# Patient Record
Sex: Female | Born: 1965 | Race: White | Hispanic: No | Marital: Married | Smoking: Never smoker
Health system: Southern US, Community
[De-identification: ages and names within clinical notes are randomized; demographics above are authoritative.]

## PROBLEM LIST (undated history)

## (undated) HISTORY — PX: OVARIAN CYST REMOVAL: SHX89

---

## 1997-12-21 ENCOUNTER — Ambulatory Visit (HOSPITAL_COMMUNITY): Admission: RE | Admit: 1997-12-21 | Discharge: 1997-12-21 | Payer: Self-pay | Admitting: Obstetrics and Gynecology

## 1998-03-10 ENCOUNTER — Inpatient Hospital Stay (HOSPITAL_COMMUNITY): Admission: AD | Admit: 1998-03-10 | Discharge: 1998-03-12 | Payer: Self-pay | Admitting: Obstetrics and Gynecology

## 1998-05-05 ENCOUNTER — Encounter (HOSPITAL_COMMUNITY): Admission: RE | Admit: 1998-05-05 | Discharge: 1998-08-03 | Payer: Self-pay | Admitting: Obstetrics and Gynecology

## 1998-08-08 ENCOUNTER — Encounter: Admission: RE | Admit: 1998-08-08 | Discharge: 1998-11-06 | Payer: Self-pay | Admitting: Obstetrics and Gynecology

## 1999-03-16 ENCOUNTER — Ambulatory Visit (HOSPITAL_BASED_OUTPATIENT_CLINIC_OR_DEPARTMENT_OTHER): Admission: RE | Admit: 1999-03-16 | Discharge: 1999-03-16 | Payer: Self-pay | Admitting: Surgery

## 2004-06-26 ENCOUNTER — Encounter: Admission: RE | Admit: 2004-06-26 | Discharge: 2004-06-26 | Payer: Self-pay | Admitting: Obstetrics and Gynecology

## 2006-11-12 ENCOUNTER — Encounter: Admission: RE | Admit: 2006-11-12 | Discharge: 2006-11-12 | Payer: Self-pay | Admitting: Obstetrics and Gynecology

## 2007-02-28 ENCOUNTER — Inpatient Hospital Stay (HOSPITAL_COMMUNITY): Admission: AD | Admit: 2007-02-28 | Discharge: 2007-02-28 | Payer: Self-pay | Admitting: Obstetrics and Gynecology

## 2007-05-08 ENCOUNTER — Inpatient Hospital Stay (HOSPITAL_COMMUNITY): Admission: AD | Admit: 2007-05-08 | Discharge: 2007-05-08 | Payer: Self-pay | Admitting: Obstetrics and Gynecology

## 2007-05-25 ENCOUNTER — Inpatient Hospital Stay (HOSPITAL_COMMUNITY): Admission: AD | Admit: 2007-05-25 | Discharge: 2007-05-25 | Payer: Self-pay | Admitting: Obstetrics and Gynecology

## 2007-05-28 ENCOUNTER — Inpatient Hospital Stay (HOSPITAL_COMMUNITY): Admission: AD | Admit: 2007-05-28 | Discharge: 2007-05-28 | Payer: Self-pay | Admitting: Obstetrics and Gynecology

## 2007-06-16 ENCOUNTER — Inpatient Hospital Stay (HOSPITAL_COMMUNITY): Admission: AD | Admit: 2007-06-16 | Discharge: 2007-06-20 | Payer: Self-pay | Admitting: Obstetrics and Gynecology

## 2007-06-17 ENCOUNTER — Encounter: Payer: Self-pay | Admitting: Obstetrics and Gynecology

## 2007-06-17 ENCOUNTER — Encounter (INDEPENDENT_AMBULATORY_CARE_PROVIDER_SITE_OTHER): Payer: Self-pay | Admitting: Obstetrics and Gynecology

## 2007-06-22 ENCOUNTER — Encounter: Admission: RE | Admit: 2007-06-22 | Discharge: 2007-07-22 | Payer: Self-pay | Admitting: Obstetrics and Gynecology

## 2007-07-02 ENCOUNTER — Ambulatory Visit: Admission: RE | Admit: 2007-07-02 | Discharge: 2007-07-02 | Payer: Self-pay | Admitting: Obstetrics and Gynecology

## 2007-07-13 ENCOUNTER — Ambulatory Visit: Admission: RE | Admit: 2007-07-13 | Discharge: 2007-07-13 | Payer: Self-pay | Admitting: Obstetrics and Gynecology

## 2007-07-23 ENCOUNTER — Encounter: Admission: RE | Admit: 2007-07-23 | Discharge: 2007-08-21 | Payer: Self-pay | Admitting: Obstetrics and Gynecology

## 2007-08-22 ENCOUNTER — Encounter: Admission: RE | Admit: 2007-08-22 | Discharge: 2007-09-14 | Payer: Self-pay | Admitting: Obstetrics and Gynecology

## 2008-03-21 ENCOUNTER — Encounter: Admission: RE | Admit: 2008-03-21 | Discharge: 2008-03-21 | Payer: Self-pay | Admitting: Obstetrics and Gynecology

## 2009-03-22 ENCOUNTER — Encounter: Admission: RE | Admit: 2009-03-22 | Discharge: 2009-03-22 | Payer: Self-pay | Admitting: Obstetrics and Gynecology

## 2010-02-28 ENCOUNTER — Other Ambulatory Visit: Payer: Self-pay | Admitting: Obstetrics and Gynecology

## 2010-02-28 DIAGNOSIS — Z1239 Encounter for other screening for malignant neoplasm of breast: Secondary | ICD-10-CM

## 2010-03-23 ENCOUNTER — Ambulatory Visit
Admission: RE | Admit: 2010-03-23 | Discharge: 2010-03-23 | Disposition: A | Discharge status: Home / Self Care | Payer: BC Managed Care – PPO | Source: Ambulatory Visit | Attending: Obstetrics and Gynecology | Admitting: Obstetrics and Gynecology

## 2010-03-23 DIAGNOSIS — Z1239 Encounter for other screening for malignant neoplasm of breast: Secondary | ICD-10-CM

## 2010-06-12 NOTE — Op Note (Signed)
Nicole [name], [name]           ACCOUNT NO.:  [id number]   MEDICAL RECORD NO.:  [id number]          PATIENT TYPE:  OUT   LOCATION:  MFM                           [facility]ILITY:  [facility]   PHYSICIAN:  Malachi Pro. Ambrose Mantle, M.D. DATE OF BIRTH:  [birth year]   DATE OF PROCEDURE:  06/17/2007  DATE OF DISCHARGE:                               OPERATIVE REPORT   PREOPERATIVE DIAGNOSES:  1. Intrauterine pregnancy at 34 weeks and 6 days.  2. Intrauterine growth restriction.  3. Oligohydramnios.  4. Abnormal Dopplers.  5. Intolerance of labor with late decelerations on Pitocin induction.   POSTOPERATIVE DIAGNOSES:  1. Intrauterine pregnancy at 34 weeks and 6 days.  2. Intrauterine growth restriction.  3. Oligohydramnios.  4. Abnormal Dopplers.  5. Intolerance of labor with late decelerations on Pitocin induction.  6. Transverse lie.   OPERATION:  1. Vertical C-section.  2. Bilateral tubal ligation.   OPERATOR:  Malachi Pro. Ambrose Mantle, MD   ASSISTANT:  Nicole Niece, MD   ANESTHESIA:  Spinal anesthesia.   PROCEDURE IN DETAIL:  The patient was brought to the operating room and  given a spinal anesthetic by Dr. Mal Amabile.  She was placed in left  lateral tilt position.  The abdomen was prepped with Betadine solution  and draped as a sterile field after the urethra was prepped and a Foley  was inserted to straight drain.  After the abdomen was draped,  anesthesia was confirmed by pinching the lower abdomen.  The patient had  no sensation.  The patient had a previous low-transverse incision for a  dermoid cyst.  This was utilized, even though it was quite low.  I went  through the skin and subcutaneous tissue and fascia.  Fascia was  separated from the rectus muscles superiorly and inferiorly.  Rectus  muscle was split in the midline.  Peritoneum was opened vertically.  I  inspected the lower uterine segment and the lower uterine segment was  not developed at all.  The lower uterine segment  was estimated to be no  more than 2-1/2 inches wide.  I palpated for presentation.  The vertex  was in the right side of the uterus.  There was a shoulder over the  cervix and the breech was high on the maternal left side.  This was  truly a transverse lie.  So with a poorly developed lower uterine  segment, I made the incision in the midline of the uterus and then  extended it superiorly and inferiorly with the bandage scissors.  I  converted the baby to a breech presentation by reaching up in the left  upper quadrant of the uterus and pulling the breech down to the  incisional opening and then delivering the baby easily as a breech  presentation.  The membranes were ruptured prior to delivery.  The  fluid, I think was clear but the baby passed meconium as it was  delivered.  The cord was clamped.  A segment of cord was preserved in  case a pH was necessary.  The infant was given to Dr. Radene Ou who was in  attendance.  The baby was female infant, I think 4 pounds 2 ounces and  Apgars of 8 at 1 and 9 at 5 minutes.  The placenta was delivered.  In  order to get to the baby, I had to go through the placenta.  I  delivered the entire placenta, palpated the inside of the uterus, and  made sure it was free of any products of conception, and then I closed  the uterine incision with a running lock suture of 0 Vicryl on the first  layer and then a second layer with interrupted figure-of-eight sutures  of 0 Vicryl and a 3-0 Vicryl was needed for complete hemostasis at one  point on the upper part of the incision.  At this point, hemostasis was  adequate.  Both tubes and ovaries appeared normal.  There were few filmy  adhesions around the right ovary but basically the dermoid cystectomy  had left little evidence of its being performed.  The midportion of each  tube was removed by creating a window in the mesosalpinx bilaterally  tying 2 ties of 0 plain catgut proximally and distally on the tube and   then cutting the intervening segment.  The left tubal segment was much  shorter than the right, but the tube segment was definitely present.  Liberal irrigation confirmed hemostasis.  The gutters were blotted free  of blood.  The abdominal wall was then closed in layers using  interrupted sutures of 0 Vicryl to include the rectus muscle and the  peritoneum, 2 running sutures of 0 Vicryl on the fascia, running 3-0  Vicryl on the subcu tissue, and staples on the skin.  The patient seemed  to tolerate the procedure well.  Blood loss was about 1000 mL.  Sponge  and needle counts were correct and she was returned to recovery in  satisfactory condition.      Malachi Pro. Ambrose Mantle, M.D.  Electronically Signed     TFH/MEDQ  D:  06/17/2007  T:  06/18/2007  Job:  [id number]   cc:   Ashley Royalty, MD

## 2010-06-12 NOTE — H&P (Signed)
NAMEZOLA, RUNION           ACCOUNT NO.:  [id number]   MEDICAL RECORD NO.:  [id number]          PATIENT TYPE:  OBV   LOCATION:  9173                          [facility]ACILITY:  [facility]   PHYSICIAN:  Malachi Pro. Ambrose Mantle, M.D. DATE OF BIRTH:  1965-11-13   DATE OF ADMISSION:  06/16/2007  DATE OF DISCHARGE:                              HISTORY & PHYSICAL   HISTORY OF PRESENT ILLNESS:  This is a 45 year old white married female  para 2-0-1-2, gravida 4, EDC July 23, 2007, admitted because of non-  reassuring fetal heart rate pattern, blood group and type A negative  with a negative antibody, nonreactive serology, rubella immune,  hepatitis B surface antigen negative, HIV negative, GC and chlamydia  negative.  One hour Glucola 100, group B strep has not been done.  This  patient began her prenatal course with a vaginal ultrasound on December 18, 2006, showed a crown-rump length of 2.34 cm, 9 weeks zero days with  an [facility] of July 23, 2007.  She did have a first trimester screen with  Down syndrome risk of 1-98 and AFP was normal.  The patient did not want  amniocentesis.  She had a relatively benign prenatal course until  March 27, 2007 when at 31 weeks and 4 days, the fundal height was 25  cm.  Nonstress tests were begun.  Ultrasound done on March 27, 2007  showed an estimated fetal weight in the 30th percentile and amniotic  fluid volume was in the 20th percentile.  Nonstress tests were done  twice a week and they have been reactive.  On Jun 15, 2007, the patient  had nonstress test that was reactive, but she did have one deceleration  that lasted a little over a minute and went down to about 60 beats per  minute.  Dr. Jackelyn Knife had her come back to the office today for another  nonstress test with similar findings.  I called Dr. Katrinka Blazing from [facility].  She advised admitting the patient to the hospital  getting about physical profile and continuously monitor the patient to  see  if these are isolated episodes.   PAST MEDICAL HISTORY:  Reveals no known allergies.   OPERATIONS:  In 1972, her tongue was clipped.  In 1989, she had a  dermoid cyst from her right ovary.  In 2000, she had a cyst removed from  her right leg.   ILLNESSES:  She has had no significant adult illnesses.   SOCIAL HISTORY:  Alcohol, tobacco, and drugs none.   FAMILY HISTORY:  Maternal grandfather with high blood pressure and heart  disease and diabetes.  Brother has high blood pressure.  Paternal  grandfather showed a mild type of renal disease.  Maternal grandmother  had rheumatoid arthritis and breast cancer.   OBSTETRIC HISTORY:  In 1992, the patient had an early abortion.  In May  1998 and in February 2000, the patient delivered female and female infants  respectively 8 pounds 7 ounces and 8 pounds 9 ounces vaginally.  The  first baby had mild shoulder dystocia, second baby no complications.   PHYSICAL EXAMINATION:  VITAL SIGNS:  On admission revealed normal vital  signs.  HEART/LUNGS:  Normal.  ABDOMEN:  Soft.   She was last examined on Jun 08, 2007.  Fundal height was 30 cm.  Fetal  heart tones are reactive, but she did have one long deceleration at  least a minute and duration.   ADMITTING IMPRESSION:  Intrauterine pregnancy at 34 weeks and 5 days  with history of brief decelerations on 2 consecutive nonstress tests  performed 24 hours apart.  I have consulted with [facility].  Their advice is to admit the patient for biophysical profile and  fetal  heart rate monitoring  in the hospital.      Malachi Pro. Ambrose Mantle, M.D.  Electronically Signed     TFH/MEDQ  D:  06/16/2007  T:  06/17/2007  Job:  [id number]

## 2010-06-12 NOTE — Discharge Summary (Signed)
Nicole [name], CONVERY           ACCOUNT NO.:  [id number]   MEDICAL RECORD NO.:  [id number]          PATIENT TYPE:  INP   LOCATION:  9310                          [facility]ILITY:  [facility]   PHYSICIAN:  Zenaida Niece, M.D.DATE OF BIRTH:  08-24-1965   DATE OF ADMISSION:  06/16/2007  DATE OF DISCHARGE:  06/20/2007                               DISCHARGE SUMMARY   ADMISSION DIAGNOSES:  1. Intrauterine pregnancy at 34+ weeks.  2. Intrauterine growth restriction.  3. Oligohydramnios.   DISCHARGE DIAGNOSES:  1. Intrauterine pregnancy at 34+ weeks.  2. Intrauterine growth restriction.  3. Oligohydramnios.  4. Fetal intolerance of labor.   PROCEDURES:  1. On Jun 17, 2007, she had a vertical C-section and bilateral tubal      ligation.  2. On Jun 20, 2007, she had an epidural blood patch.   HISTORY AND PHYSICAL:  This is a 45 year old female gravida 4, para 2-0-  1-2 who is admitted after she was noted to have spontaneous decels to  60s to 12s on NST for 2 consecutive days.  Tracing was otherwise  reactive and biophysical profile 8/8, although the AFI was low normal.  Prenatal care has been otherwise uneventful except for an elevated risk  of Down syndrome of 1 in 98.   PAST OB HISTORY:  See dictated H & P.   PAST MEDICAL HISTORY:  Negative.   PAST SURGICAL HISTORY:  Tongue surgery.  She has had a right dermoid  removed and a sebaceous cyst removed from her leg.   ALLERGIES:  None known.   MEDICATIONS:  None.   PRENATAL LABS:  Blood type is A negative with negative antibody screen,  RPR nonreactive, rubella immune, hepatitis B surface antigen negative,  HIV negative, gonorrhea and chlamydia negative.  Please see dictated  history and physical for the remaining history and physical exam,  significant for gravid abdomen that was nontender.   HOSPITAL COURSE:  The patient was admitted and put on continuous  monitoring.  Overall the tracing looked reactive.  She was monitored  overnight on the Jun 16, 2007.  Early on the morning of Jun 17, 2007,  Dr. Senaida Ores was called for fetal decels.  Tracing was overall  reactive, but she overnight had had several contractions with subtle  late decels, but with good variability and had accelerations also.  She  had several spontaneous decels to the 80s to 90s with rapid recovery.  She had an ultrasound performed on the morning of Jun 17, 2007, which  revealed an amniotic fluid volume less than 30% percent, estimated fetal  weight of 10% and abnormal uterine umbilical artery Dopplers.  Dr.  Ambrose Mantle started Pitocin and the baby did not tolerate this, so on the  afternoon of Jun 17, 2007, she was taken to the operating room where Dr.  Ambrose Mantle performed a vertical C-section and tubal ligation under spinal  anesthesia.  Estimated blood loss was 1000 mL.  She delivered a viable  female with Apgars of 8 and 9 that weighed 4 pounds 2 ounces.  Postoperatively, the patient initially did well.  Preoperative  hemoglobin 11.3, postoperative 10.4.  She did complain of a headache and  was started on a caffeine drip which did eventually improve the  headache.  However, Anesthesia evaluated her on the evening of Jun 19, 2007, and did feel this was consistent with a spinal headache, but  wanted to proceed with conservative therapy.  On postoperative day #3,  Jun 20, 2007, her headache was worse, but she was otherwise doing well.  She was evaluated again by Dr. Harvest Forest and had an epidural blood patch  placed.  Her headache improved and she was felt to be stable enough for  discharge home.   DISCHARGE INSTRUCTIONS:  Regular diet, pelvic rest, no strenuous  activity.  Follow-up is in 2 weeks for an incision check.   Medications are Percocet #20 p.r.n. pain and over-the-counter ibuprofen  as needed and she is given her discharge pamphlet.      Zenaida Niece, M.D.  Electronically Signed     TDM/MEDQ  D:  07/30/2007  T:  07/30/2007   Job:  [id number]

## 2010-10-23 LAB — RH IMMUNE GLOBULIN WORKUP (NOT WOMEN'S HOSP): ABO/RH(D): A NEG

## 2010-10-23 LAB — GLUCOSE TOLERANCE, 1 HOUR: Glucose, 1 Hour GTT: 100

## 2010-10-24 LAB — RH IMMUNE GLOB WKUP(>/=20WKS)(NOT WOMEN'S HOSP)

## 2010-10-24 LAB — CBC
MCHC: 34.5
MCV: 97.3
Platelets: 165
RBC: 3.06 — ABNORMAL LOW
RBC: 3.37 — ABNORMAL LOW
WBC: 9.4

## 2010-10-24 LAB — KLEIHAUER-BETKE STAIN: # Vials RhIg: 1

## 2011-03-08 ENCOUNTER — Other Ambulatory Visit: Payer: Self-pay | Admitting: Obstetrics and Gynecology

## 2011-03-08 DIAGNOSIS — Z1231 Encounter for screening mammogram for malignant neoplasm of breast: Secondary | ICD-10-CM

## 2011-04-02 ENCOUNTER — Ambulatory Visit
Admission: RE | Admit: 2011-04-02 | Discharge: 2011-04-02 | Disposition: A | Discharge status: Home / Self Care | Payer: BC Managed Care – PPO | Source: Ambulatory Visit | Attending: Obstetrics and Gynecology | Admitting: Obstetrics and Gynecology

## 2011-04-02 DIAGNOSIS — Z1231 Encounter for screening mammogram for malignant neoplasm of breast: Secondary | ICD-10-CM

## 2012-04-28 ENCOUNTER — Other Ambulatory Visit: Payer: Self-pay

## 2012-04-28 DIAGNOSIS — Z1231 Encounter for screening mammogram for malignant neoplasm of breast: Secondary | ICD-10-CM

## 2012-06-05 ENCOUNTER — Ambulatory Visit
Admission: RE | Admit: 2012-06-05 | Discharge: 2012-06-05 | Disposition: A | Discharge status: Home / Self Care | Payer: Medicare HMO | Source: Ambulatory Visit

## 2012-06-05 DIAGNOSIS — Z1231 Encounter for screening mammogram for malignant neoplasm of breast: Secondary | ICD-10-CM

## 2013-10-25 ENCOUNTER — Other Ambulatory Visit: Payer: Self-pay

## 2013-10-25 DIAGNOSIS — Z1231 Encounter for screening mammogram for malignant neoplasm of breast: Secondary | ICD-10-CM

## 2013-11-04 ENCOUNTER — Ambulatory Visit
Admission: RE | Admit: 2013-11-04 | Discharge: 2013-11-04 | Disposition: A | Discharge status: Home / Self Care | Payer: BC Managed Care – PPO | Source: Ambulatory Visit

## 2013-11-04 DIAGNOSIS — Z1231 Encounter for screening mammogram for malignant neoplasm of breast: Secondary | ICD-10-CM

## 2014-02-08 ENCOUNTER — Other Ambulatory Visit: Payer: Self-pay | Admitting: Obstetrics and Gynecology

## 2014-02-08 DIAGNOSIS — N6011 Diffuse cystic mastopathy of right breast: Secondary | ICD-10-CM

## 2014-02-15 ENCOUNTER — Ambulatory Visit
Admission: RE | Admit: 2014-02-15 | Discharge: 2014-02-15 | Disposition: A | Discharge status: Home / Self Care | Payer: BLUE CROSS/BLUE SHIELD | Source: Ambulatory Visit | Attending: Obstetrics and Gynecology | Admitting: Obstetrics and Gynecology

## 2014-02-15 ENCOUNTER — Other Ambulatory Visit: Payer: Self-pay | Admitting: Obstetrics and Gynecology

## 2014-02-15 DIAGNOSIS — N6011 Diffuse cystic mastopathy of right breast: Secondary | ICD-10-CM

## 2015-02-17 ENCOUNTER — Other Ambulatory Visit: Payer: Self-pay

## 2015-02-17 DIAGNOSIS — Z1231 Encounter for screening mammogram for malignant neoplasm of breast: Secondary | ICD-10-CM

## 2015-03-14 ENCOUNTER — Ambulatory Visit: Payer: BLUE CROSS/BLUE SHIELD

## 2015-03-28 ENCOUNTER — Ambulatory Visit
Admission: RE | Admit: 2015-03-28 | Discharge: 2015-03-28 | Disposition: A | Discharge status: Home / Self Care | Payer: BLUE CROSS/BLUE SHIELD | Source: Ambulatory Visit

## 2015-03-28 DIAGNOSIS — Z1231 Encounter for screening mammogram for malignant neoplasm of breast: Secondary | ICD-10-CM

## 2016-03-11 ENCOUNTER — Encounter: Payer: Self-pay | Admitting: *Deleted

## 2016-03-11 ENCOUNTER — Emergency Department (INDEPENDENT_AMBULATORY_CARE_PROVIDER_SITE_OTHER)
Admission: EM | Admit: 2016-03-11 | Discharge: 2016-03-11 | Disposition: A | Discharge status: Home / Self Care | Payer: 59 | Source: Home / Self Care | Attending: Family Medicine | Admitting: Family Medicine

## 2016-03-11 DIAGNOSIS — S39012A Strain of muscle, fascia and tendon of lower back, initial encounter: Secondary | ICD-10-CM

## 2016-03-11 MED ORDER — PREDNISONE 20 MG PO TABS: ORAL_TABLET | ORAL | 0 refills | Status: AC

## 2016-03-11 MED ORDER — HYDROCODONE-ACETAMINOPHEN 5-325 MG PO TABS: ORAL_TABLET | ORAL | 0 refills | Status: AC

## 2016-03-11 NOTE — ED Triage Notes (Signed)
Patient reports bending and reaching for her hairdryer yesterday AM and felt sudden severe pain in her lower back that radiated to both legs. No previous injuries. Pain has persisted. Taken 3 Advil this AM.

## 2016-03-11 NOTE — Discharge Instructions (Signed)
Apply ice pack for 20 to 30 minutes, 3 to 4 times daily  Continue until pain and swelling decrease.  Begin back range of motion and stretching exercises as tolerated.  After finishing prednisone, may resume Ibuprofen as needed.

## 2016-03-11 NOTE — ED Provider Notes (Signed)
[facility]    CSN: [id number] Arrival date & time: 03/11/16  [id number]     History   Chief Complaint Chief Complaint  Patient presents with  . Back Pain    HPI Nicole [name] is a 51 y.o. female.   Yesterday morning patient reached down to her left to pick up a hair dryer beneath a bathroom vanity.  She felt sudden sharp severe pain in her lower back that radiated to her hips.  The pain has persisted and is worse on her right.  She has pain when walking.   She denies bowel or bladder dysfunction, and no saddle numbness.  She has no history of back pain, and exercises regularly.     The history is provided by the patient.  Back Pain  Location:  Lumbar spine Quality:  Stabbing Radiates to: buttocks and hips. Pain severity:  Severe Pain is:  Same all the time Onset quality:  Sudden Duration:  1 day Timing:  Constant Progression:  Unchanged Chronicity:  New Context comment:  Bending over Relieved by:  Nothing Worsened by:  Ambulation Ineffective treatments:  NSAIDs Associated symptoms: no abdominal pain, no bladder incontinence, no bowel incontinence, no dysuria, no fever, no leg pain, no numbness, no paresthesias, no pelvic pain, no perianal numbness, no tingling, no weakness and no weight loss     History reviewed. No pertinent past medical history.  There are no active problems to display for this patient.   Past Surgical History:  Procedure Laterality Date  . CESAREAN SECTION    . OVARIAN CYST REMOVAL      OB History    No data available       Home Medications    Prior to Admission medications   Medication Sig Start Date End Date Taking? Authorizing Provider  HYDROcodone-acetaminophen (NORCO/VICODIN) 5-325 MG tablet Take one by mouth at bedtime as needed for pain 03/11/16   Lattie HawStephen A Marriah Sanderlin, MD  predniSONE (DELTASONE) 20 MG tablet Take one tab by mouth twice daily for 5 days, then one daily for 3 days. Take with food. 03/11/16   Lattie HawStephen A Jequan Shahin,  MD    Family History Family History  Problem Relation Age of Onset  . Lymphoma Father     Social History Social History  Substance Use Topics  . Smoking status: Never Smoker  . Smokeless tobacco: Never Used  . Alcohol use Yes     Allergies   Patient has no known allergies.   Review of Systems Review of Systems  Constitutional: Negative for fever and weight loss.  Gastrointestinal: Negative for abdominal pain and bowel incontinence.  Genitourinary: Negative for bladder incontinence, dysuria and pelvic pain.  Musculoskeletal: Positive for back pain.  Neurological: Negative for tingling, weakness, numbness and paresthesias.  All other systems reviewed and are negative.    Physical Exam Triage Vital Signs ED Triage Vitals  Enc Vitals Group     BP 03/11/16 1041 117/76     Pulse Rate 03/11/16 1041 65     Resp --      Temp --      Temp src --      SpO2 03/11/16 1041 97 %     Weight 03/11/16 1042 119 lb (54 kg)     Height 03/11/16 1042 5\' 4"  (1.626 m)     Head Circumference --      Peak Flow --      Pain Score 03/11/16 1045 7     Pain Loc --  Pain Edu? --      Excl. in GC? --    No data found.   Updated Vital Signs BP 117/76 (BP Location: Left Arm)   Pulse 65   Ht 5\' 4"  (1.626 m)   Wt 119 lb (54 kg)   LMP 02/26/2016   SpO2 97%   BMI 20.43 kg/m   Visual Acuity Right Eye Distance:   Left Eye Distance:   Bilateral Distance:    Right Eye Near:   Left Eye Near:    Bilateral Near:     Physical Exam  Constitutional: She appears well-developed and well-nourished. No distress.  Patient appears uncomfortable with movement and ambulation.  HENT:  Head: Normocephalic.  Nose: Nose normal.  Mouth/Throat: Oropharynx is clear and moist.  Eyes: Conjunctivae are normal. Pupils are equal, round, and reactive to light.  Neck: Normal range of motion.  Cardiovascular: Normal heart sounds.   Pulmonary/Chest: Breath sounds normal.  Abdominal: There is no  tenderness.  Musculoskeletal: She exhibits no edema.       Lumbar back: She exhibits decreased range of motion. She exhibits no tenderness, no bony tenderness, no swelling and no edema.       Back:  Back:    Can heel/toe walk and squat without difficulty.  Decreased forward flexion.  Patient experiences pain in her right lumbar area but there is no tenderness to palpation there.  Straight leg raising test is negative.  Sitting knee extension test is negative.  Strength and sensation in the lower extremities is normal.  Patellar and achilles reflexes are normal.  FABER negative.   Neurological: She is alert.  Skin: Skin is warm and dry. No rash noted.  Nursing note and vitals reviewed.    UC Treatments / Results  Labs (all labs ordered are listed, but only abnormal results are displayed) Labs Reviewed - No data to display  EKG  EKG Interpretation None       Radiology No results found.  Procedures Procedures (including critical care time)  Medications Ordered in UC Medications - No data to display   Initial Impression / Assessment and Plan / UC Course  I have reviewed the triage vital signs and the nursing notes.  Pertinent labs & imaging results that were available during my care of the patient were reviewed by me and considered in my medical decision making (see chart for details).    No evidence radiculopathy. Begin prednisone burst/taper.  Lortab for pain at night. Apply ice pack for 20 to 30 minutes, 3 to 4 times daily  Continue until pain and swelling decrease.  Begin back range of motion and stretching exercises as tolerated.  After finishing prednisone, may resume Ibuprofen as needed. Followup with Dr. Rodney Langton or Dr. Clementeen [facility] ([facility]) if not improving about two weeks.     Final Clinical Impressions(s) / UC Diagnoses   Final diagnoses:  Strain of lumbar region, initial encounter    New Prescriptions New Prescriptions    HYDROCODONE-ACETAMINOPHEN (NORCO/VICODIN) 5-325 MG TABLET    Take one by mouth at bedtime as needed for pain   PREDNISONE (DELTASONE) 20 MG TABLET    Take one tab by mouth twice daily for 5 days, then one daily for 3 days. Take with food.     Lattie Haw, MD 03/11/16 1147

## 2016-04-02 ENCOUNTER — Other Ambulatory Visit: Payer: Self-pay | Admitting: Obstetrics and Gynecology

## 2016-04-02 DIAGNOSIS — Z1231 Encounter for screening mammogram for malignant neoplasm of breast: Secondary | ICD-10-CM

## 2016-04-23 ENCOUNTER — Ambulatory Visit
Admission: RE | Admit: 2016-04-23 | Discharge: 2016-04-23 | Disposition: A | Discharge status: Home / Self Care | Payer: 59 | Source: Ambulatory Visit | Attending: Obstetrics and Gynecology | Admitting: Obstetrics and Gynecology

## 2016-04-23 DIAGNOSIS — Z1231 Encounter for screening mammogram for malignant neoplasm of breast: Secondary | ICD-10-CM

## 2016-04-24 ENCOUNTER — Other Ambulatory Visit: Payer: Self-pay | Admitting: Obstetrics and Gynecology

## 2016-04-24 DIAGNOSIS — R928 Other abnormal and inconclusive findings on diagnostic imaging of breast: Secondary | ICD-10-CM

## 2016-04-26 ENCOUNTER — Ambulatory Visit
Admission: RE | Admit: 2016-04-26 | Discharge: 2016-04-26 | Disposition: A | Discharge status: Home / Self Care | Payer: 59 | Source: Ambulatory Visit | Attending: Obstetrics and Gynecology | Admitting: Obstetrics and Gynecology

## 2016-04-26 DIAGNOSIS — R928 Other abnormal and inconclusive findings on diagnostic imaging of breast: Secondary | ICD-10-CM

## 2017-04-18 ENCOUNTER — Other Ambulatory Visit: Payer: Self-pay | Admitting: Obstetrics and Gynecology

## 2017-04-18 DIAGNOSIS — Z1231 Encounter for screening mammogram for malignant neoplasm of breast: Secondary | ICD-10-CM

## 2017-05-13 ENCOUNTER — Ambulatory Visit
Admission: RE | Admit: 2017-05-13 | Discharge: 2017-05-13 | Disposition: A | Discharge status: Home / Self Care | Payer: 59 | Source: Ambulatory Visit | Attending: Obstetrics and Gynecology | Admitting: Obstetrics and Gynecology

## 2017-05-13 DIAGNOSIS — Z1231 Encounter for screening mammogram for malignant neoplasm of breast: Secondary | ICD-10-CM

## 2018-06-23 ENCOUNTER — Other Ambulatory Visit: Payer: Self-pay | Admitting: Obstetrics and Gynecology

## 2018-06-23 DIAGNOSIS — Z1231 Encounter for screening mammogram for malignant neoplasm of breast: Secondary | ICD-10-CM

## 2018-08-07 ENCOUNTER — Ambulatory Visit: Payer: 59

## 2018-08-10 ENCOUNTER — Ambulatory Visit
Admission: RE | Admit: 2018-08-10 | Discharge: 2018-08-10 | Disposition: A | Discharge status: Home / Self Care | Payer: 59 | Source: Ambulatory Visit | Attending: Obstetrics and Gynecology | Admitting: Obstetrics and Gynecology

## 2018-08-10 ENCOUNTER — Other Ambulatory Visit: Payer: Self-pay

## 2018-08-10 DIAGNOSIS — Z1231 Encounter for screening mammogram for malignant neoplasm of breast: Secondary | ICD-10-CM

## 2019-10-28 ENCOUNTER — Other Ambulatory Visit: Payer: Self-pay | Admitting: Obstetrics and Gynecology

## 2019-10-28 DIAGNOSIS — Z1231 Encounter for screening mammogram for malignant neoplasm of breast: Secondary | ICD-10-CM

## 2019-11-18 ENCOUNTER — Ambulatory Visit: Payer: 59

## 2019-12-15 ENCOUNTER — Ambulatory Visit
Admission: RE | Admit: 2019-12-15 | Discharge: 2019-12-15 | Disposition: A | Discharge status: Home / Self Care | Payer: Commercial Managed Care - PPO | Source: Ambulatory Visit | Attending: Obstetrics and Gynecology | Admitting: Obstetrics and Gynecology

## 2019-12-15 ENCOUNTER — Other Ambulatory Visit: Payer: Self-pay

## 2019-12-15 DIAGNOSIS — Z1231 Encounter for screening mammogram for malignant neoplasm of breast: Secondary | ICD-10-CM

## 2019-12-22 ENCOUNTER — Other Ambulatory Visit: Payer: Self-pay | Admitting: Obstetrics and Gynecology

## 2019-12-22 DIAGNOSIS — R928 Other abnormal and inconclusive findings on diagnostic imaging of breast: Secondary | ICD-10-CM

## 2020-01-04 ENCOUNTER — Other Ambulatory Visit: Payer: Self-pay

## 2020-01-04 ENCOUNTER — Ambulatory Visit: Payer: Commercial Managed Care - PPO

## 2020-01-04 ENCOUNTER — Ambulatory Visit
Admission: RE | Admit: 2020-01-04 | Discharge: 2020-01-04 | Disposition: A | Discharge status: Home / Self Care | Payer: Commercial Managed Care - PPO | Source: Ambulatory Visit | Attending: Obstetrics and Gynecology | Admitting: Obstetrics and Gynecology

## 2020-01-04 DIAGNOSIS — R928 Other abnormal and inconclusive findings on diagnostic imaging of breast: Secondary | ICD-10-CM

## 2021-02-06 ENCOUNTER — Other Ambulatory Visit: Payer: Self-pay | Admitting: Obstetrics and Gynecology

## 2021-02-06 DIAGNOSIS — Z1231 Encounter for screening mammogram for malignant neoplasm of breast: Secondary | ICD-10-CM

## 2021-02-21 ENCOUNTER — Ambulatory Visit
Admission: RE | Admit: 2021-02-21 | Discharge: 2021-02-21 | Disposition: A | Discharge status: Home / Self Care | Payer: Commercial Managed Care - PPO | Source: Ambulatory Visit | Attending: Obstetrics and Gynecology | Admitting: Obstetrics and Gynecology

## 2021-02-21 ENCOUNTER — Other Ambulatory Visit: Payer: Self-pay

## 2021-02-21 DIAGNOSIS — Z1231 Encounter for screening mammogram for malignant neoplasm of breast: Secondary | ICD-10-CM

## 2022-02-06 ENCOUNTER — Other Ambulatory Visit: Payer: Self-pay | Admitting: Nurse Practitioner

## 2022-02-06 DIAGNOSIS — Z1231 Encounter for screening mammogram for malignant neoplasm of breast: Secondary | ICD-10-CM

## 2022-04-01 ENCOUNTER — Ambulatory Visit
Admission: RE | Admit: 2022-04-01 | Discharge: 2022-04-01 | Disposition: A | Discharge status: Home / Self Care | Payer: BLUE CROSS/BLUE SHIELD | Source: Ambulatory Visit | Attending: Nurse Practitioner | Admitting: Nurse Practitioner

## 2022-04-01 DIAGNOSIS — Z1231 Encounter for screening mammogram for malignant neoplasm of breast: Secondary | ICD-10-CM

## 2023-02-25 ENCOUNTER — Other Ambulatory Visit: Payer: Self-pay | Admitting: Gynecology

## 2023-02-25 DIAGNOSIS — Z1231 Encounter for screening mammogram for malignant neoplasm of breast: Secondary | ICD-10-CM

## 2023-04-02 ENCOUNTER — Ambulatory Visit: Payer: BLUE CROSS/BLUE SHIELD

## 2023-04-09 ENCOUNTER — Ambulatory Visit

## 2023-04-21 ENCOUNTER — Ambulatory Visit
Admission: RE | Admit: 2023-04-21 | Discharge: 2023-04-21 | Disposition: A | Discharge status: Home / Self Care | Source: Ambulatory Visit | Attending: Gynecology | Admitting: Gynecology

## 2023-04-21 DIAGNOSIS — Z1231 Encounter for screening mammogram for malignant neoplasm of breast: Secondary | ICD-10-CM

## 2023-10-02 ENCOUNTER — Other Ambulatory Visit: Payer: Self-pay | Admitting: Gynecology

## 2023-10-02 DIAGNOSIS — Z780 Asymptomatic menopausal state: Secondary | ICD-10-CM

## 2023-10-02 DIAGNOSIS — Z13820 Encounter for screening for osteoporosis: Secondary | ICD-10-CM

## 2023-12-28 IMAGING — MG MM DIGITAL SCREENING BILAT W/ TOMO AND CAD
6 of 10 series · 6 of 30 positions shown · non-contrast
Comparison: Previous exam(s).

CLINICAL DATA: Screening.

EXAM:
DIGITAL SCREENING BILATERAL MAMMOGRAM WITH TOMOSYNTHESIS AND CAD
TECHNIQUE: Bilateral screening digital craniocaudal and mediolateral oblique
mammograms were obtained. Bilateral screening digital breast
tomosynthesis was performed. The images were evaluated with
computer-aided detection.

[L MLO synth-2D]
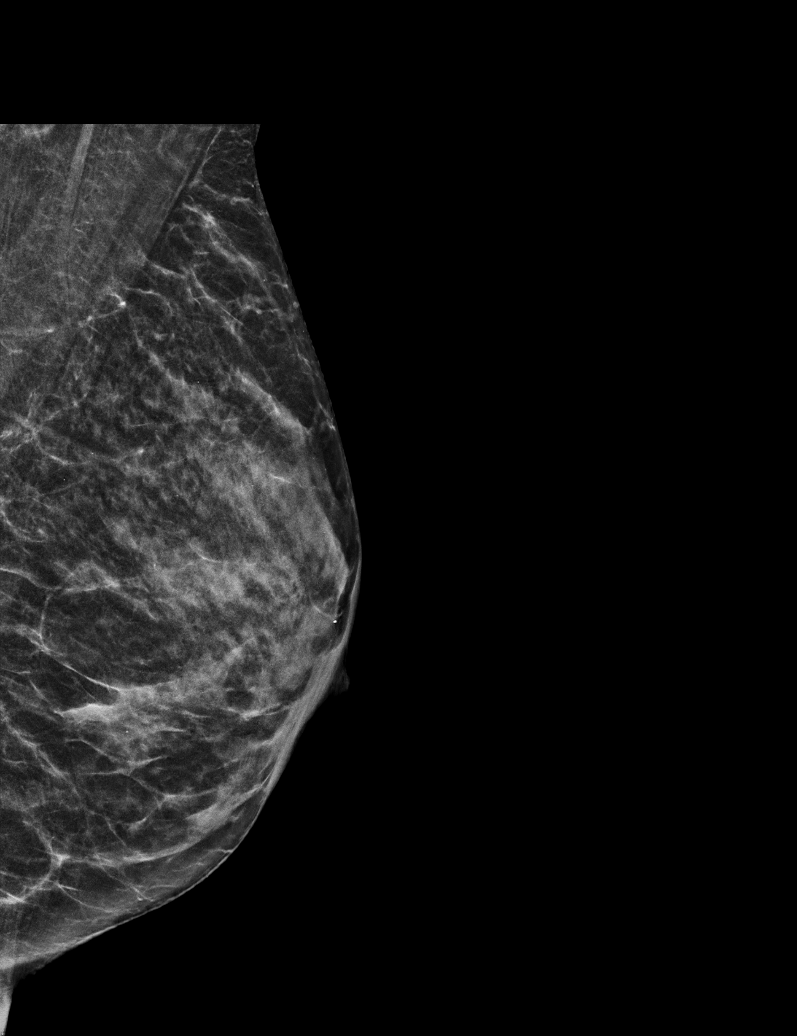

[R MLO synth-2D]
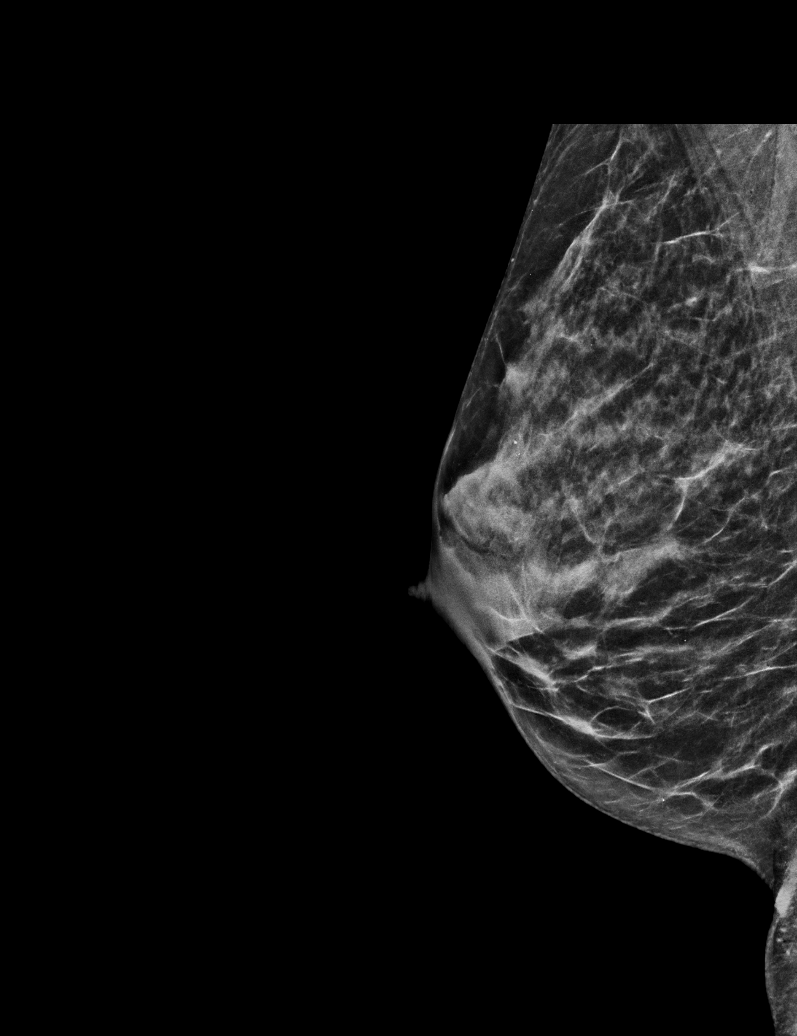

[L XCCL synth-2D]
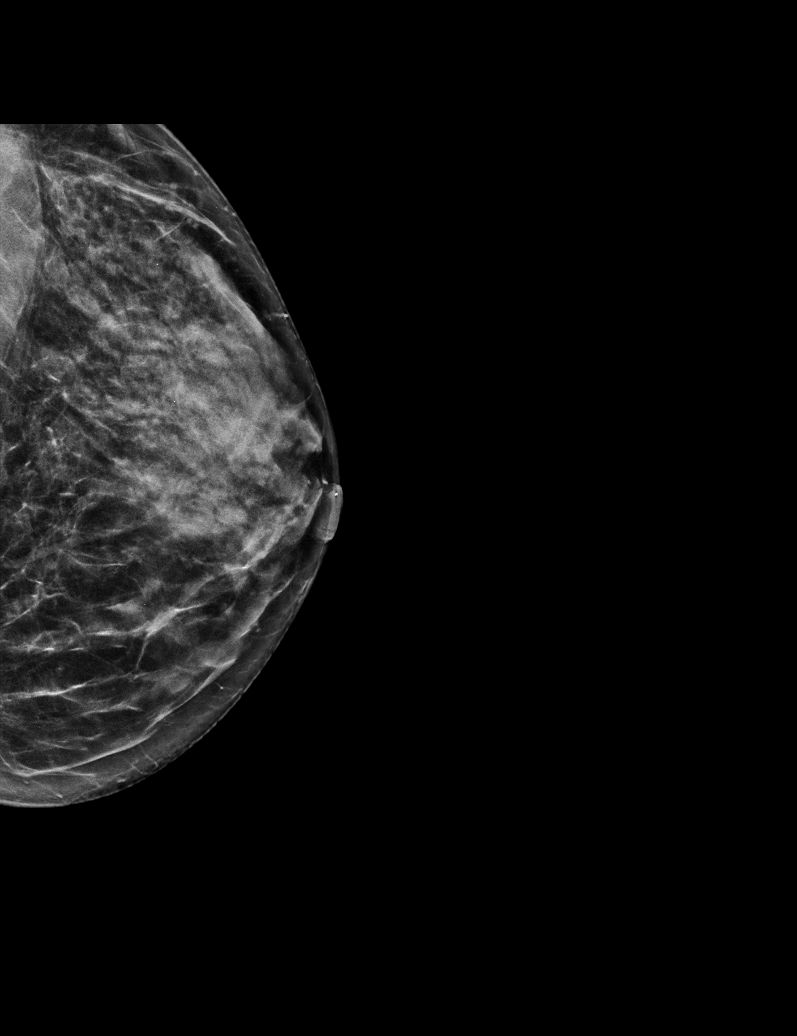

[R CC synth-2D]
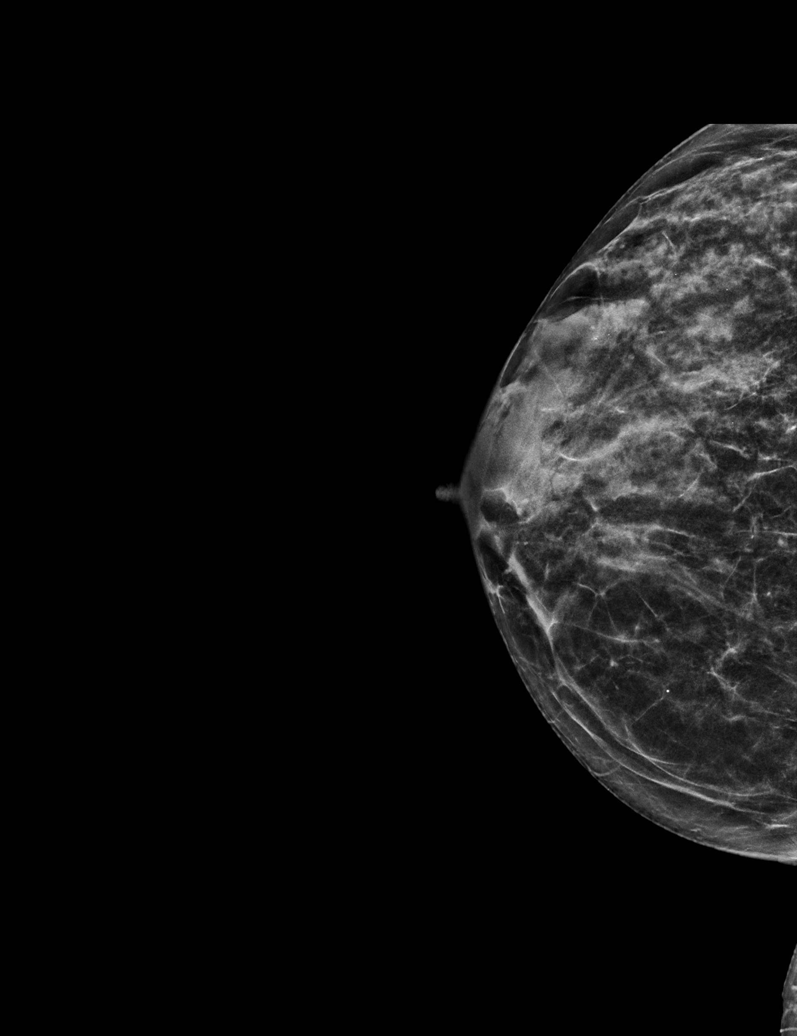

[L CC synth-2D]
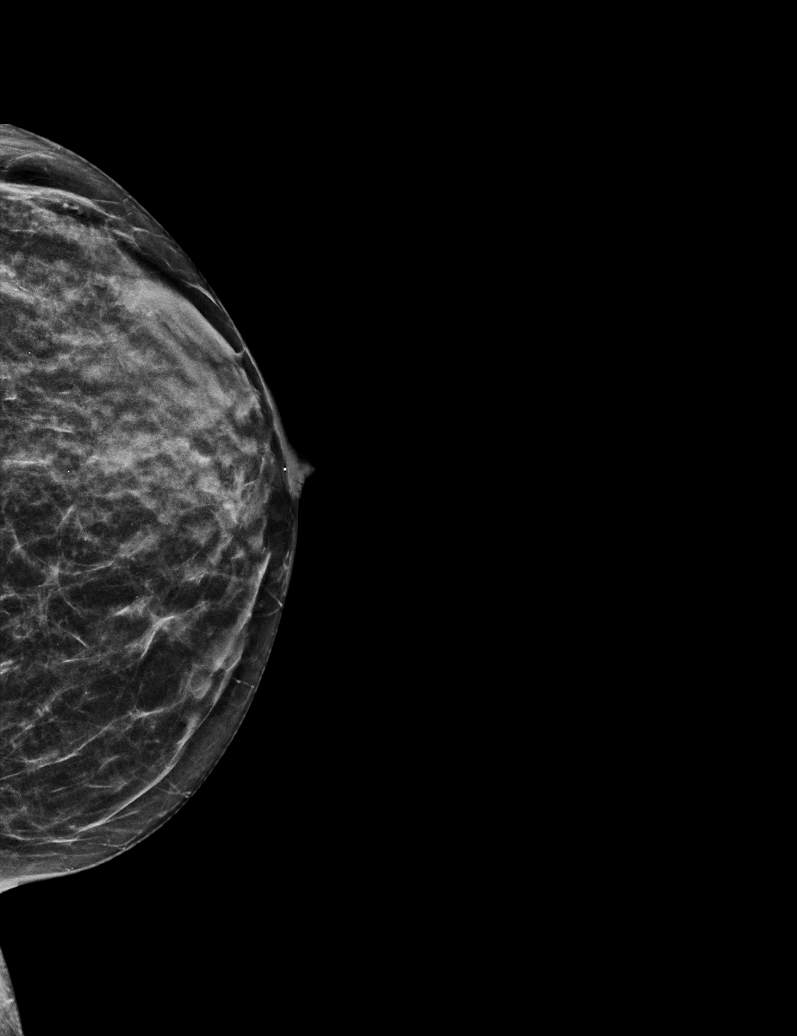

[L XCCL tomo · tomo slice 26/51.0]
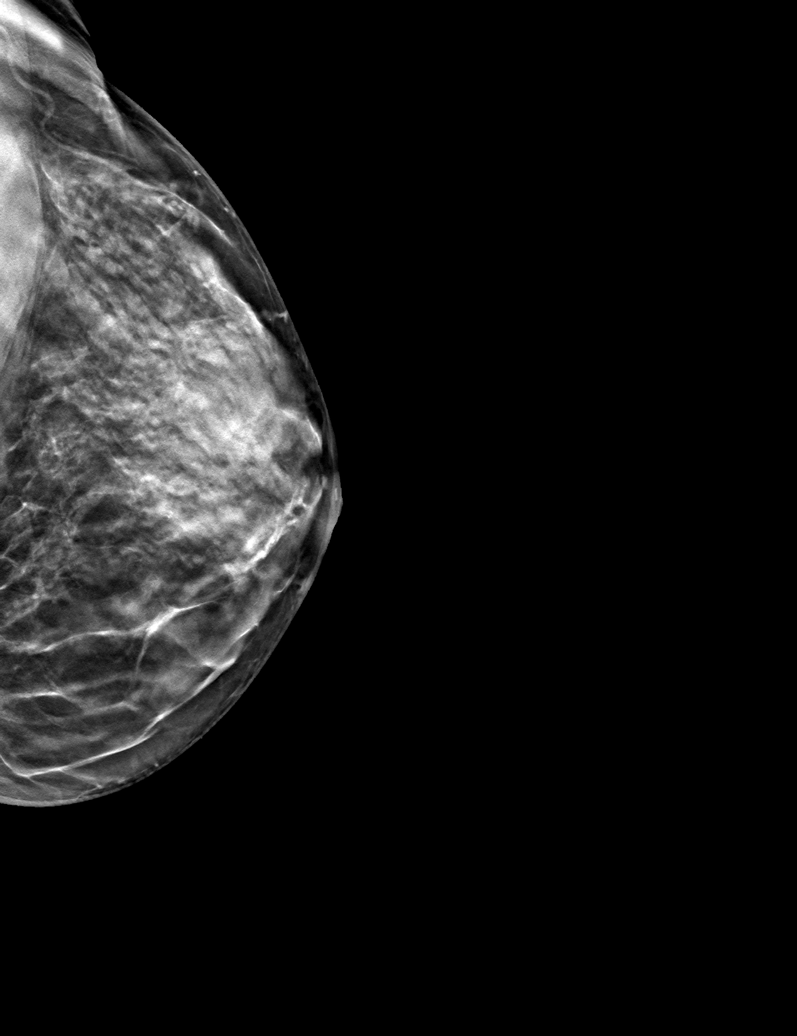

[6 of 30 positions shown; findings below may reference images not displayed]

ACR Breast Density Category c: The breast tissue is heterogeneously
dense, which may obscure small masses.
FINDINGS: There are no findings suspicious for malignancy.
IMPRESSION: No mammographic evidence of malignancy. A result letter of this
screening mammogram will be mailed directly to the patient.

RECOMMENDATION:
Screening mammogram in one year. (Code:Q3-W-BC3)

BI-RADS CATEGORY  1: Negative.

## 2023-12-31 ENCOUNTER — Ambulatory Visit

## 2023-12-31 DIAGNOSIS — Z780 Asymptomatic menopausal state: Secondary | ICD-10-CM

## 2023-12-31 DIAGNOSIS — Z13820 Encounter for screening for osteoporosis: Secondary | ICD-10-CM | POA: Diagnosis not present
